# Patient Record
Sex: Female | Born: 2000
Health system: Southern US, Community
[De-identification: ages and names within clinical notes are randomized; demographics above are authoritative.]

## PROBLEM LIST (undated history)

## (undated) DIAGNOSIS — R079 Chest pain, unspecified: Secondary | ICD-10-CM

## (undated) DIAGNOSIS — R109 Unspecified abdominal pain: Secondary | ICD-10-CM

## (undated) DIAGNOSIS — D249 Benign neoplasm of unspecified breast: Secondary | ICD-10-CM

## (undated) DIAGNOSIS — U071 COVID-19: Secondary | ICD-10-CM

## (undated) HISTORY — DX: Unspecified abdominal pain: R10.9

## (undated) HISTORY — DX: Benign neoplasm of unspecified breast: D24.9

## (undated) HISTORY — DX: COVID-19: U07.1

## (undated) HISTORY — PX: NO PAST SURGERIES: SHX2092

## (undated) HISTORY — DX: Chest pain, unspecified: R07.9

---
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
## 2022-01-08 ENCOUNTER — Other Ambulatory Visit: Payer: Self-pay | Admitting: Student

## 2022-01-08 DIAGNOSIS — D241 Benign neoplasm of right breast: Secondary | ICD-10-CM

## 2022-01-22 ENCOUNTER — Other Ambulatory Visit: Payer: Self-pay

## 2022-01-22 ENCOUNTER — Other Ambulatory Visit: Payer: Self-pay | Admitting: Student

## 2022-01-22 ENCOUNTER — Ambulatory Visit
Admission: RE | Admit: 2022-01-22 | Discharge: 2022-01-22 | Disposition: A | Discharge status: Home / Self Care | Payer: BC Managed Care – PPO | Source: Ambulatory Visit | Attending: Student | Admitting: Student

## 2022-01-22 DIAGNOSIS — D241 Benign neoplasm of right breast: Secondary | ICD-10-CM

## 2022-01-28 ENCOUNTER — Ambulatory Visit: Admission: RE | Admit: 2022-01-28 | Payer: BC Managed Care – PPO | Source: Ambulatory Visit

## 2022-01-28 ENCOUNTER — Ambulatory Visit
Admission: RE | Admit: 2022-01-28 | Discharge: 2022-01-28 | Disposition: A | Discharge status: Home / Self Care | Payer: BC Managed Care – PPO | Source: Ambulatory Visit | Attending: Student | Admitting: Student

## 2022-01-28 DIAGNOSIS — D241 Benign neoplasm of right breast: Secondary | ICD-10-CM

## 2022-01-29 ENCOUNTER — Other Ambulatory Visit: Payer: BLUE CROSS/BLUE SHIELD

## 2022-02-05 ENCOUNTER — Other Ambulatory Visit: Payer: Self-pay | Admitting: General Surgery

## 2022-02-05 DIAGNOSIS — D241 Benign neoplasm of right breast: Secondary | ICD-10-CM

## 2022-02-09 ENCOUNTER — Other Ambulatory Visit: Payer: Self-pay | Admitting: General Surgery

## 2022-02-09 DIAGNOSIS — D241 Benign neoplasm of right breast: Secondary | ICD-10-CM

## 2022-02-27 ENCOUNTER — Other Ambulatory Visit: Payer: Self-pay

## 2022-02-27 ENCOUNTER — Encounter (HOSPITAL_BASED_OUTPATIENT_CLINIC_OR_DEPARTMENT_OTHER): Payer: Self-pay | Admitting: General Surgery

## 2022-03-03 MED ORDER — ENSURE PRE-SURGERY PO LIQD: 296.0000 mL | Freq: Once | ORAL | Status: DC

## 2022-03-03 NOTE — Progress Notes (Signed)

## 2022-03-09 ENCOUNTER — Ambulatory Visit
Admission: RE | Admit: 2022-03-09 | Discharge: 2022-03-09 | Disposition: A | Discharge status: Home / Self Care | Payer: BC Managed Care – PPO | Source: Ambulatory Visit | Attending: General Surgery | Admitting: General Surgery

## 2022-03-09 ENCOUNTER — Other Ambulatory Visit: Payer: Self-pay | Admitting: General Surgery

## 2022-03-09 DIAGNOSIS — D241 Benign neoplasm of right breast: Secondary | ICD-10-CM

## 2022-03-10 ENCOUNTER — Ambulatory Visit (HOSPITAL_BASED_OUTPATIENT_CLINIC_OR_DEPARTMENT_OTHER): Payer: BC Managed Care – PPO | Admitting: Anesthesiology

## 2022-03-10 ENCOUNTER — Encounter (HOSPITAL_BASED_OUTPATIENT_CLINIC_OR_DEPARTMENT_OTHER): Payer: Self-pay | Admitting: General Surgery

## 2022-03-10 ENCOUNTER — Other Ambulatory Visit: Payer: Self-pay

## 2022-03-10 ENCOUNTER — Encounter (HOSPITAL_BASED_OUTPATIENT_CLINIC_OR_DEPARTMENT_OTHER)
Admission: RE | Disposition: A | Discharge status: Home / Self Care | Payer: Self-pay | Source: Home / Self Care | Attending: General Surgery

## 2022-03-10 ENCOUNTER — Ambulatory Visit
Admission: RE | Admit: 2022-03-10 | Discharge: 2022-03-10 | Disposition: A | Discharge status: Home / Self Care | Payer: BC Managed Care – PPO | Source: Ambulatory Visit | Attending: General Surgery | Admitting: General Surgery

## 2022-03-10 ENCOUNTER — Ambulatory Visit (HOSPITAL_BASED_OUTPATIENT_CLINIC_OR_DEPARTMENT_OTHER)
Admission: RE | Admit: 2022-03-10 | Discharge: 2022-03-10 | Disposition: A | Discharge status: Home / Self Care | Payer: BC Managed Care – PPO | Attending: General Surgery | Admitting: General Surgery

## 2022-03-10 DIAGNOSIS — Z79899 Other long term (current) drug therapy: Secondary | ICD-10-CM | POA: Insufficient documentation

## 2022-03-10 DIAGNOSIS — D241 Benign neoplasm of right breast: Secondary | ICD-10-CM | POA: Insufficient documentation

## 2022-03-10 DIAGNOSIS — N6021 Fibroadenosis of right breast: Secondary | ICD-10-CM | POA: Diagnosis not present

## 2022-03-10 HISTORY — PX: RADIOACTIVE SEED GUIDED EXCISIONAL BREAST BIOPSY: SHX6490

## 2022-03-10 LAB — POCT PREGNANCY, URINE: Preg Test, Ur: NEGATIVE

## 2022-03-10 SURGERY — RADIOACTIVE SEED GUIDED BREAST BIOPSY
Anesthesia: General | Site: Breast | Laterality: Right

## 2022-03-10 MED ORDER — DEXAMETHASONE SODIUM PHOSPHATE 10 MG/ML IJ SOLN
INTRAMUSCULAR | Status: DC | PRN
  Administered 2022-03-10: 5 mg via INTRAVENOUS

## 2022-03-10 MED ORDER — CEFAZOLIN SODIUM-DEXTROSE 2-4 GM/100ML-% IV SOLN
INTRAVENOUS | Status: AC
  Filled 2022-03-10: qty 100

## 2022-03-10 MED ORDER — PROPOFOL 10 MG/ML IV BOLUS
INTRAVENOUS | Status: AC
  Filled 2022-03-10: qty 20

## 2022-03-10 MED ORDER — MIDAZOLAM HCL 2 MG/2ML IJ SOLN
INTRAMUSCULAR | Status: AC
  Filled 2022-03-10: qty 2

## 2022-03-10 MED ORDER — ONDANSETRON HCL 4 MG/2ML IJ SOLN
INTRAMUSCULAR | Status: AC
  Filled 2022-03-10: qty 2

## 2022-03-10 MED ORDER — FENTANYL CITRATE (PF) 100 MCG/2ML IJ SOLN
25.0000 ug | INTRAMUSCULAR | Status: DC | PRN
  Administered 2022-03-10 (×3): 50 ug via INTRAVENOUS

## 2022-03-10 MED ORDER — OXYCODONE HCL 5 MG PO TABS
ORAL_TABLET | ORAL | Status: AC
  Filled 2022-03-10: qty 1

## 2022-03-10 MED ORDER — CHLORHEXIDINE GLUCONATE CLOTH 2 % EX PADS: 6.0000 | MEDICATED_PAD | Freq: Once | CUTANEOUS | Status: DC

## 2022-03-10 MED ORDER — LIDOCAINE 2% (20 MG/ML) 5 ML SYRINGE
INTRAMUSCULAR | Status: AC
  Filled 2022-03-10: qty 5

## 2022-03-10 MED ORDER — FENTANYL CITRATE (PF) 100 MCG/2ML IJ SOLN
INTRAMUSCULAR | Status: AC
Start: 2022-03-10 — End: ?
  Filled 2022-03-10: qty 2

## 2022-03-10 MED ORDER — ACETAMINOPHEN 500 MG PO TABS
1000.0000 mg | ORAL_TABLET | ORAL | Status: AC
  Administered 2022-03-10: 1000 mg via ORAL

## 2022-03-10 MED ORDER — OXYCODONE HCL 5 MG/5ML PO SOLN: 5.0000 mg | Freq: Once | ORAL | Status: AC | PRN

## 2022-03-10 MED ORDER — DEXAMETHASONE SODIUM PHOSPHATE 10 MG/ML IJ SOLN
INTRAMUSCULAR | Status: AC
  Filled 2022-03-10: qty 1

## 2022-03-10 MED ORDER — ACETAMINOPHEN 500 MG PO TABS: 1000.0000 mg | ORAL_TABLET | Freq: Once | ORAL | Status: DC | PRN

## 2022-03-10 MED ORDER — ACETAMINOPHEN 160 MG/5ML PO SOLN: 1000.0000 mg | Freq: Once | ORAL | Status: DC | PRN

## 2022-03-10 MED ORDER — FENTANYL CITRATE (PF) 100 MCG/2ML IJ SOLN
INTRAMUSCULAR | Status: AC
  Filled 2022-03-10: qty 2

## 2022-03-10 MED ORDER — TRAMADOL HCL 50 MG PO TABS: 100.0000 mg | ORAL_TABLET | Freq: Four times a day (QID) | ORAL | 0 refills | Status: DC | PRN

## 2022-03-10 MED ORDER — MIDAZOLAM HCL 5 MG/5ML IJ SOLN
INTRAMUSCULAR | Status: DC | PRN
  Administered 2022-03-10: 2 mg via INTRAVENOUS

## 2022-03-10 MED ORDER — ACETAMINOPHEN 500 MG PO TABS
ORAL_TABLET | ORAL | Status: AC
Start: 2022-03-10 — End: ?
  Filled 2022-03-10: qty 2

## 2022-03-10 MED ORDER — LACTATED RINGERS IV SOLN: INTRAVENOUS | Status: DC

## 2022-03-10 MED ORDER — OXYCODONE HCL 5 MG PO TABS
5.0000 mg | ORAL_TABLET | Freq: Once | ORAL | Status: AC | PRN
  Administered 2022-03-10: 5 mg via ORAL

## 2022-03-10 MED ORDER — BUPIVACAINE HCL (PF) 0.25 % IJ SOLN
INTRAMUSCULAR | Status: DC | PRN
  Administered 2022-03-10: 20 mL

## 2022-03-10 MED ORDER — PROPOFOL 500 MG/50ML IV EMUL
INTRAVENOUS | Status: DC | PRN
  Administered 2022-03-10: 150 ug/kg/min via INTRAVENOUS

## 2022-03-10 MED ORDER — CEFAZOLIN SODIUM-DEXTROSE 2-4 GM/100ML-% IV SOLN
2.0000 g | INTRAVENOUS | Status: AC
  Administered 2022-03-10: 2 g via INTRAVENOUS

## 2022-03-10 MED ORDER — ONDANSETRON HCL 4 MG/2ML IJ SOLN
INTRAMUSCULAR | Status: DC | PRN
  Administered 2022-03-10: 4 mg via INTRAVENOUS

## 2022-03-10 MED ORDER — ACETAMINOPHEN 10 MG/ML IV SOLN: 1000.0000 mg | Freq: Once | INTRAVENOUS | Status: DC | PRN

## 2022-03-10 MED ORDER — FENTANYL CITRATE (PF) 100 MCG/2ML IJ SOLN
INTRAMUSCULAR | Status: DC | PRN
Start: 2022-03-10 — End: 2022-03-10
  Administered 2022-03-10 (×2): 25 ug via INTRAVENOUS
  Administered 2022-03-10 (×2): 50 ug via INTRAVENOUS

## 2022-03-10 MED ORDER — PROPOFOL 10 MG/ML IV BOLUS
INTRAVENOUS | Status: DC | PRN
  Administered 2022-03-10: 200 mg via INTRAVENOUS

## 2022-03-10 SURGICAL SUPPLY — 55 items
ADH SKN CLS APL DERMABOND .7 (GAUZE/BANDAGES/DRESSINGS) ×1
APL PRP STRL LF DISP 70% ISPRP (MISCELLANEOUS) ×1
APPLIER CLIP 9.375 MED OPEN (MISCELLANEOUS)
APR CLP MED 9.3 20 MLT OPN (MISCELLANEOUS)
BINDER BREAST LRG (GAUZE/BANDAGES/DRESSINGS) IMPLANT
BINDER BREAST MEDIUM (GAUZE/BANDAGES/DRESSINGS) IMPLANT
BINDER BREAST XLRG (GAUZE/BANDAGES/DRESSINGS) IMPLANT
BINDER BREAST XXLRG (GAUZE/BANDAGES/DRESSINGS) IMPLANT
BLADE SURG 15 STRL LF DISP TIS (BLADE) ×1 IMPLANT
BLADE SURG 15 STRL SS (BLADE) ×2
CANISTER SUC SOCK COL 7IN (MISCELLANEOUS) IMPLANT
CANISTER SUCT 1200ML W/VALVE (MISCELLANEOUS) IMPLANT
CHLORAPREP W/TINT 26 (MISCELLANEOUS) ×2 IMPLANT
CLIP APPLIE 9.375 MED OPEN (MISCELLANEOUS) IMPLANT
CLIP TI WIDE RED SMALL 6 (CLIP) IMPLANT
COVER BACK TABLE 60X90IN (DRAPES) ×2 IMPLANT
COVER MAYO STAND STRL (DRAPES) ×2 IMPLANT
COVER PROBE W GEL 5X96 (DRAPES) ×2 IMPLANT
DERMABOND ADVANCED (GAUZE/BANDAGES/DRESSINGS) ×1
DERMABOND ADVANCED .7 DNX12 (GAUZE/BANDAGES/DRESSINGS) ×1 IMPLANT
DRAPE LAPAROSCOPIC ABDOMINAL (DRAPES) ×2 IMPLANT
DRAPE UTILITY XL STRL (DRAPES) ×2 IMPLANT
DRSG TEGADERM 4X4.75 (GAUZE/BANDAGES/DRESSINGS) IMPLANT
ELECT COATED BLADE 2.86 ST (ELECTRODE) ×2 IMPLANT
ELECT REM PT RETURN 9FT ADLT (ELECTROSURGICAL) ×2
ELECTRODE REM PT RTRN 9FT ADLT (ELECTROSURGICAL) ×1 IMPLANT
GAUZE SPONGE 4X4 12PLY STRL LF (GAUZE/BANDAGES/DRESSINGS) IMPLANT
GLOVE SURG ENC MOIS LTX SZ7 (GLOVE) ×4 IMPLANT
GLOVE SURG UNDER POLY LF SZ7.5 (GLOVE) ×2 IMPLANT
GOWN STRL REUS W/ TWL LRG LVL3 (GOWN DISPOSABLE) ×2 IMPLANT
GOWN STRL REUS W/TWL LRG LVL3 (GOWN DISPOSABLE) ×4
HEMOSTAT ARISTA ABSORB 3G PWDR (HEMOSTASIS) IMPLANT
KIT MARKER MARGIN INK (KITS) ×2 IMPLANT
NDL HYPO 25X1 1.5 SAFETY (NEEDLE) ×1 IMPLANT
NEEDLE HYPO 25X1 1.5 SAFETY (NEEDLE) ×2 IMPLANT
NS IRRIG 1000ML POUR BTL (IV SOLUTION) IMPLANT
PACK BASIN DAY SURGERY FS (CUSTOM PROCEDURE TRAY) ×2 IMPLANT
PENCIL SMOKE EVACUATOR (MISCELLANEOUS) ×2 IMPLANT
RETRACTOR ONETRAX LX 90X20 (MISCELLANEOUS) ×1 IMPLANT
SLEEVE SCD COMPRESS KNEE MED (STOCKING) ×2 IMPLANT
SPIKE FLUID TRANSFER (MISCELLANEOUS) IMPLANT
SPONGE T-LAP 4X18 ~~LOC~~+RFID (SPONGE) ×2 IMPLANT
STRIP CLOSURE SKIN 1/2X4 (GAUZE/BANDAGES/DRESSINGS) ×2 IMPLANT
SUT MNCRL AB 4-0 PS2 18 (SUTURE) ×1 IMPLANT
SUT MON AB 5-0 PS2 18 (SUTURE) IMPLANT
SUT SILK 2 0 SH (SUTURE) IMPLANT
SUT VIC AB 2-0 SH 27 (SUTURE) ×2
SUT VIC AB 2-0 SH 27XBRD (SUTURE) ×1 IMPLANT
SUT VIC AB 3-0 SH 27 (SUTURE) ×2
SUT VIC AB 3-0 SH 27X BRD (SUTURE) ×1 IMPLANT
SYR CONTROL 10ML LL (SYRINGE) ×2 IMPLANT
TOWEL GREEN STERILE FF (TOWEL DISPOSABLE) ×2 IMPLANT
TRAY FAXITRON CT DISP (TRAY / TRAY PROCEDURE) ×2 IMPLANT
TUBE CONNECTING 20X1/4 (TUBING) IMPLANT
YANKAUER SUCT BULB TIP NO VENT (SUCTIONS) IMPLANT

## 2022-03-10 NOTE — Anesthesia Procedure Notes (Signed)
Procedure Name: LMA Insertion ?Date/Time: 03/10/2022 10:57 AM ?Performed by: Glory Buff, CRNA ?Pre-anesthesia Checklist: Patient identified, Emergency Drugs available, Suction available and Patient being monitored ?Patient Re-evaluated:Patient Re-evaluated prior to induction ?Oxygen Delivery Method: Circle system utilized ?Preoxygenation: Pre-oxygenation with 100% oxygen ?Induction Type: IV induction ?LMA: LMA inserted ?LMA Size: 4.0 ?Number of attempts: 1 ?Placement Confirmation: positive ETCO2 ?Tube secured with: Tape ?Dental Injury: Teeth and Oropharynx as per pre-operative assessment  ? ? ? ? ?

## 2022-03-10 NOTE — Discharge Instructions (Addendum)
Call your surgeon if you experience:  ? ?1.  Fever over 101.0. ?2.  Inability to urinate. ?3.  Nausea and/or vomiting. ?4.  Extreme swelling or bruising at the surgical site. ?5.  Continued bleeding from the incision. ?6.  Increased pain, redness or drainage from the incision. ?7.  Problems related to your pain medication. ?8.  Any problems and/or concerns  ? ?May take Tylenol after 3pm, if needed.  ? ? ?Post Anesthesia Home Care Instructions ? ?Activity: ?Get plenty of rest for the remainder of the day. A responsible individual must stay with you for 24 hours following the procedure.  ?For the next 24 hours, DO NOT: ?-Drive a car ?-Paediatric nurse ?-Drink alcoholic beverages ?-Take any medication unless instructed by your physician ?-Make any legal decisions or sign important papers. ? ?Meals: ?Start with liquid foods such as gelatin or soup. Progress to regular foods as tolerated. Avoid greasy, spicy, heavy foods. If nausea and/or vomiting occur, drink only clear liquids until the nausea and/or vomiting subsides. Call your physician if vomiting continues. ? ?Special Instructions/Symptoms: ?Your throat may feel dry or sore from the anesthesia or the breathing tube placed in your throat during surgery. If this causes discomfort, gargle with warm salt water. The discomfort should disappear within 24 hours. ? ?If you had a scopolamine patch placed behind your ear for the management of post- operative nausea and/or vomiting: ? ?1. The medication in the patch is effective for 72 hours, after which it should be removed.  Wrap patch in a tissue and discard in the trash. Wash hands thoroughly with soap and water. ?2. You may remove the patch earlier than 72 hours if you experience unpleasant side effects which may include dry mouth, dizziness or visual disturbances. ?3. Avoid touching the patch. Wash your hands with soap and water after contact with the patch. ?    ?

## 2022-03-10 NOTE — Anesthesia Preprocedure Evaluation (Addendum)
Anesthesia Evaluation  ?Patient identified by MRN, date of birth, ID band ?Patient awake ? ? ? ?Reviewed: ?Allergy & Precautions, NPO status , Patient's Chart, lab work & pertinent test results ? ?History of Anesthesia Complications ?Negative for: history of anesthetic complications ? ?Airway ?Mallampati: I ? ?TM Distance: >3 FB ?Neck ROM: Full ? ? ? Dental ? ?(+) Teeth Intact, Dental Advisory Given ?  ?Pulmonary ?neg pulmonary ROS,  ?  ?breath sounds clear to auscultation ? ? ? ? ? ? Cardiovascular ?negative cardio ROS ? ? ?Rhythm:Regular  ? ?  ?Neuro/Psych ?negative neurological ROS ? negative psych ROS  ? GI/Hepatic ?negative GI ROS, Neg liver ROS,   ?Endo/Other  ?negative endocrine ROS ? Renal/GU ?negative Renal ROS  ? ?  ?Musculoskeletal ?negative musculoskeletal ROS ?(+)  ? Abdominal ?  ?Peds ? Hematology ?negative hematology ROS ?(+)   ?Anesthesia Other Findings ? ? Reproductive/Obstetrics ?Lab Results ?     Component                Value               Date                 ?     PREGTESTUR               NEGATIVE            03/10/2022           ? ? ?  ? ? ? ? ? ? ? ? ? ? ? ? ? ?  ?  ? ? ? ? ? ? ? ?Anesthesia Physical ?Anesthesia Plan ? ?ASA: 1 ? ?Anesthesia Plan: General  ? ?Post-op Pain Management: Toradol IV (intra-op)*  ? ?Induction: Intravenous ? ?PONV Risk Score and Plan: 3 and Ondansetron, Dexamethasone, Propofol infusion, Midazolam and TIVA ? ?Airway Management Planned: LMA ? ?Additional Equipment: None ? ?Intra-op Plan:  ? ?Post-operative Plan: Extubation in OR ? ?Informed Consent: I have reviewed the patients History and Physical, chart, labs and discussed the procedure including the risks, benefits and alternatives for the proposed anesthesia with the patient or authorized representative who has indicated his/her understanding and acceptance.  ? ? ? ?Dental advisory given ? ?Plan Discussed with: CRNA ? ?Anesthesia Plan Comments:   ? ? ? ? ? ? ?Anesthesia Quick  Evaluation ? ?

## 2022-03-10 NOTE — Op Note (Signed)
Preoperative diagnosis: Right breast fibroadenoma ?Postoperative diagnosis: Same as above ?Procedure: Right breast radioactive seed guided excisional biopsy ?Surgeon: Dr. Serita Grammes ?Anesthesia: General ?Specimens: Right breast mass to pathology ?Complications: None ?Drains: None ?Sponge needle count was correct at completion ?Disposition to recovery stable condition ? ?Indications:This is an 21 year old female who I saw in October 2021 with a 3.4 cm fibroadenoma. I excised this. This pathology was a fibroadenoma. This was in December 2021. Since then she has recently over the last several months noticed a mass in the similar position. This is in the upper outer right breast. Ultrasound showed a 2.2 cm mass. Biopsy of this is a fibroadenoma. She would like to consider excision due to discomfort..  This was vague to feel so I did have a seed placed prior to beginning ? ?Procedure: After informed consent was obtained the patient was taken to the operating room.  She had a seed placed prior to beginning.  She was given antibiotics.  SCDs were placed.  She was placed under general anesthesia without complication. ? ?I then infiltrated Marcaine and the old scar as well as throughout the right upper outer quadrant.  The seed and the mass were in the very upper outer quadrant.  I did excise her old scar as it was mildly hypertrophic.  I then used cautery to dissected the seed.  This was very difficult still to palpate as it was in very dense tissue.  The seed and the clip or I think were on the outer part of this is I removed both of these separately and then removed what was a palpable mass.  This was marked with paint.  I then obtained hemostasis.  I closed the deep breast tissue with 2-0 Vicryl sutures.  I closed the skin with 3-0 Vicryl and 5-0 Monocryl.  Glue and some Steri-Strips were applied.  She tolerated this well was extubated transferred to recovery stable. ?

## 2022-03-10 NOTE — Interval H&P Note (Signed)
History and Physical Interval Note: ? ?03/10/2022 ?10:34 AM ? ?Rebecca Matthews  has presented today for surgery, with the diagnosis of RIGHT BREAST MASS.  The various methods of treatment have been discussed with the patient and family. After consideration of risks, benefits and other options for treatment, the patient has consented to  Procedure(s): ?RADIOACTIVE SEED GUIDED EXCISIONAL RIGHT BREAST BIOPSY (Right) as a surgical intervention.  The patient's history has been reviewed, patient examined, no change in status, stable for surgery.  I have reviewed the patient's chart and labs.  Questions were answered to the patient's satisfaction.   ? ? ?Rolm Bookbinder ? ? ?

## 2022-03-10 NOTE — H&P (Signed)
?  This is an 21 year old female who I saw in October 2021 with a 3.4 cm fibroadenoma. I excised this. This pathology was a fibroadenoma. This was in December 2021. Since then she has recently over the last several months noticed a mass in the similar position. This is in the upper outer right breast. Ultrasound showed a 2.2 cm mass. Biopsy of this is a fibroadenoma. She would like to consider excision due to discomfort.. ? ? ?Review of Systems: ?A complete review of systems was obtained from the patient. I have reviewed this information and discussed as appropriate with the patient. See HPI as well for other ROS. ? ?Review of Systems  ?All other systems reviewed and are negative. ? ? ?Medical History: ?History reviewed. No pertinent past medical history. ? ?Patient Active Problem List  ?Diagnosis  ? Abdominal pain, bilateral lower quadrant  ? Lactose intolerance  ? ?Past Surgical History:  ?Procedure Laterality Date  ? BREAST EXCISIONAL BIOPSY Right  ? ? ?No Known Allergies ? ?Current Outpatient Medications on File Prior to Visit  ?Medication Sig Dispense Refill  ? dicyclomine (BENTYL) 20 mg tablet 1 tablet daily before school. 60 tablet 1  ? ?No current facility-administered medications on file prior to visit.  ? ?History reviewed. No pertinent family history.  ? ?Social History  ? ?Tobacco Use  ?Smoking Status Never  ?Smokeless Tobacco Never  ? ? ?Social History  ? ?Socioeconomic History  ? Marital status: Single  ?Tobacco Use  ? Smoking status: Never  ? Smokeless tobacco: Never  ?Substance and Sexual Activity  ? Alcohol use: Never  ? Drug use: Never  ? ?Objective:  ? ?Physical Exam ?Constitutional:  ?Appearance: Normal appearance.  ?Chest:  ?Breasts: ?Right: Mass present. No inverted nipple or nipple discharge.  ?Left: No inverted nipple, mass or nipple discharge.  ?Comments: Vague 2 cm mass  ?Lymphadenopathy:  ?Upper Body:  ?Right upper body: No supraclavicular or axillary adenopathy.  ?Left upper body: No  supraclavicular or axillary adenopathy.  ?Neurological:  ?Mental Status: She is alert.  ? ? ? ?Assessment and Plan:  ? ?Fibroadenoma of right breast ? ?Right breast mass seed guided excisional biopsy ? ?We discussed options including a 58-monthfollow-up versus excision. She would like to have this area excised. I think it is unlikely that the other 1 recurred but there may have been one near this that has now grown larger. We discussed excision using seed guidance. I will plan on excising her old scar as well. She is going to call when she figures out timing.  ?

## 2022-03-10 NOTE — Anesthesia Postprocedure Evaluation (Signed)
Anesthesia Post Note ? ?Patient: Rebecca Matthews ? ?Procedure(s) Performed: RADIOACTIVE SEED GUIDED EXCISIONAL RIGHT BREAST BIOPSY (Right: Breast) ? ?  ? ?Patient location during evaluation: PACU ?Anesthesia Type: General ?Level of consciousness: awake and alert ?Pain management: pain level controlled ?Vital Signs Assessment: post-procedure vital signs reviewed and stable ?Respiratory status: spontaneous breathing, nonlabored ventilation, respiratory function stable and patient connected to nasal cannula oxygen ?Cardiovascular status: blood pressure returned to baseline and stable ?Postop Assessment: no apparent nausea or vomiting ?Anesthetic complications: no ? ? ?No notable events documented. ? ?Last Vitals:  ?Vitals:  ? 03/10/22 1228 03/10/22 1314  ?BP: 100/62 105/63  ?Pulse: 69 67  ?Resp: (!) 21 15  ?Temp:  37.1 ?C  ?SpO2: 100% 98%  ?  ?Last Pain:  ?Vitals:  ? 03/10/22 1314  ?TempSrc: Oral  ?PainSc: 4   ? ? ?  ?  ?  ?  ?  ?  ? ?Octavia Mottola L Consandra Laske ? ? ? ? ?

## 2022-03-10 NOTE — Transfer of Care (Signed)
Immediate Anesthesia Transfer of Care Note ? ?Patient: Rebecca Matthews ? ?Procedure(s) Performed: RADIOACTIVE SEED GUIDED EXCISIONAL RIGHT BREAST BIOPSY (Right: Breast) ? ?Patient Location: PACU ? ?Anesthesia Type:General ? ?Level of Consciousness: drowsy and patient cooperative ? ?Airway & Oxygen Therapy: Patient Spontanous Breathing and Patient connected to face mask oxygen ? ?Post-op Assessment: Report given to RN and Post -op Vital signs reviewed and stable ? ?Post vital signs: Reviewed and stable ? ?Last Vitals:  ?Vitals Value Taken Time  ?BP    ?Temp    ?Pulse 98 03/10/22 1145  ?Resp 20 03/10/22 1145  ?SpO2 100 % 03/10/22 1145  ?Vitals shown include unvalidated device data. ? ?Last Pain:  ?Vitals:  ? 03/10/22 0855  ?TempSrc: Oral  ?PainSc: 0-No pain  ?   ? ?Patients Stated Pain Goal: 1 (03/10/22 0855) ? ?Complications: No notable events documented. ?

## 2022-03-11 ENCOUNTER — Encounter (HOSPITAL_BASED_OUTPATIENT_CLINIC_OR_DEPARTMENT_OTHER): Payer: Self-pay | Admitting: General Surgery

## 2022-03-11 ENCOUNTER — Encounter (HOSPITAL_COMMUNITY): Payer: Self-pay

## 2022-03-11 NOTE — Progress Notes (Signed)
Left message stating courtesy call and if any questions or concerns please call the doctors office.  

## 2022-03-12 LAB — SURGICAL PATHOLOGY

## 2023-01-18 IMAGING — US US NEEDLE LOCALIZATION*R*
1 series · 2 of 2 positions shown · non-contrast
Comparison: Previous exam(s).

CLINICAL DATA: Radioactive seed localization of RIGHT breast
fibroadenoma prior to excision.

EXAM:
ULTRASOUND GUIDED RADIOACTIVE SEED LOCALIZATION OF THE RIGHT BREAST

[Series 1: us needle localization*right* · 0.06mm/px · 2 of 2 slices shown]
[im 1/2]
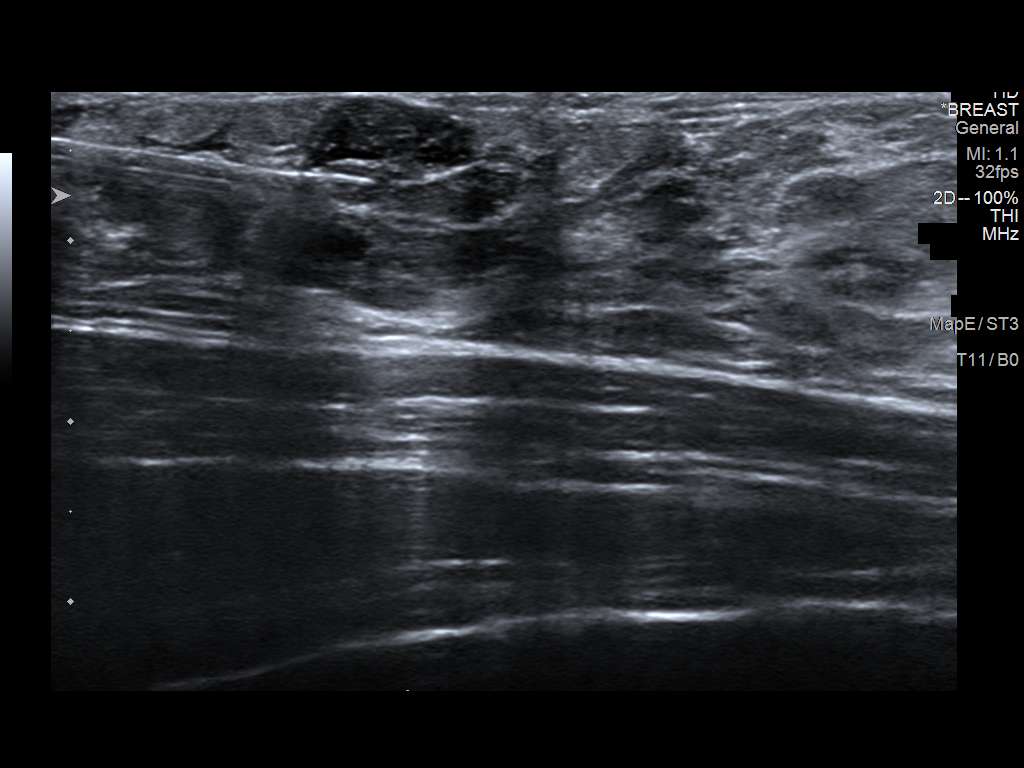
[im 2/2]
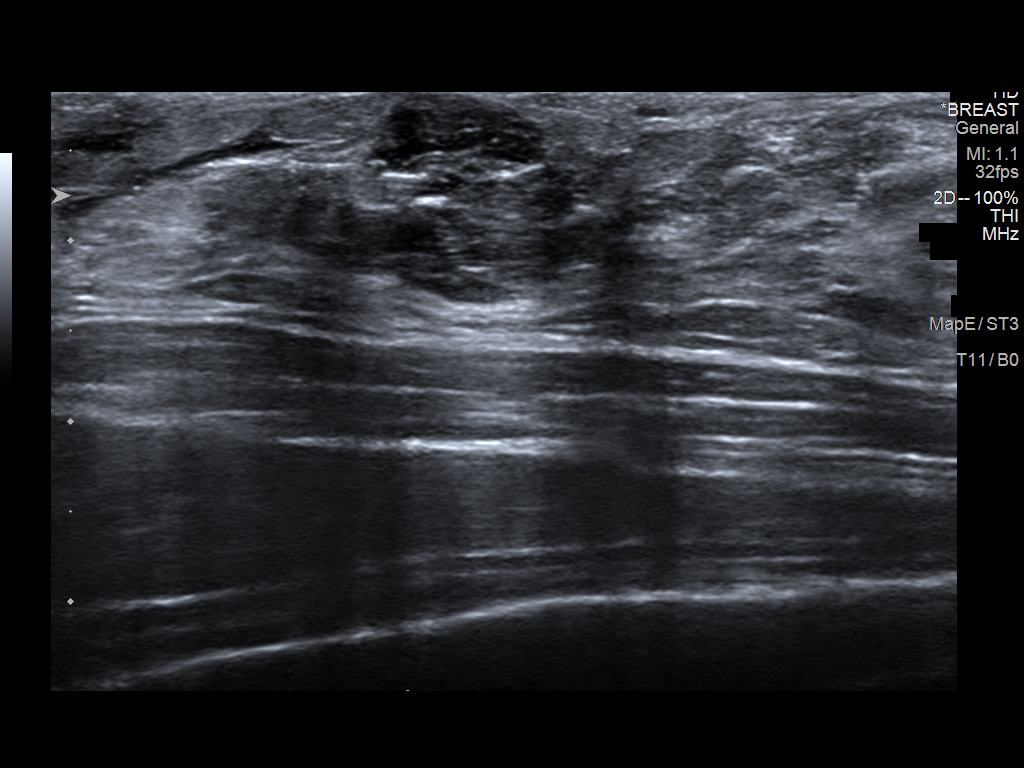

[2 of 2 positions shown; findings below may reference images not displayed]

FINDINGS: Patient presents for radioactive seed localization prior to RIGHT
breast excision. I met with the patient and we discussed the
procedure of seed localization including benefits and alternatives.
We discussed the high likelihood of a successful procedure. We
discussed the risks of the procedure including infection, bleeding,
tissue injury and further surgery. We discussed the low dose of
radioactivity involved in the procedure. Informed, written consent
was given.

The usual time-out protocol was performed immediately prior to the
procedure.

Using ultrasound guidance, sterile technique, 1% lidocaine and an
E-[id number] radioactive seed, the 2.2 cm mass at the 10 o'clock position
of the RIGHT breast 6 cm from the nipple was localized using a
LATERAL approach. Satisfactory position was confirmed
sonographically.

Follow-up survey of the patient confirms presence of the radioactive
seed.

Order number of [id number] seed:  [id number].

Total activity:  0.239 millicuries.  Reference Date: 02/02/2022.

The patient tolerated the procedure well and was released from the
[REDACTED]. She was given instructions regarding seed removal.
IMPRESSION: Radioactive seed localization RIGHT breast. No apparent
complications.

## 2023-01-19 IMAGING — MG MM BREAST SURGICAL SPECIMEN
2 series · 4 of 4 positions shown · non-contrast
Comparison: Previous exam(s).

CLINICAL DATA: Status post surgical excision after earlier
radioactive seed localization.

EXAM:
SPECIMEN RADIOGRAPH OF THE RIGHT BREAST

[Series 1: R · right · 0.07mm/px · 2 of 2 slices shown (1 of 2)]
[im 1/2]
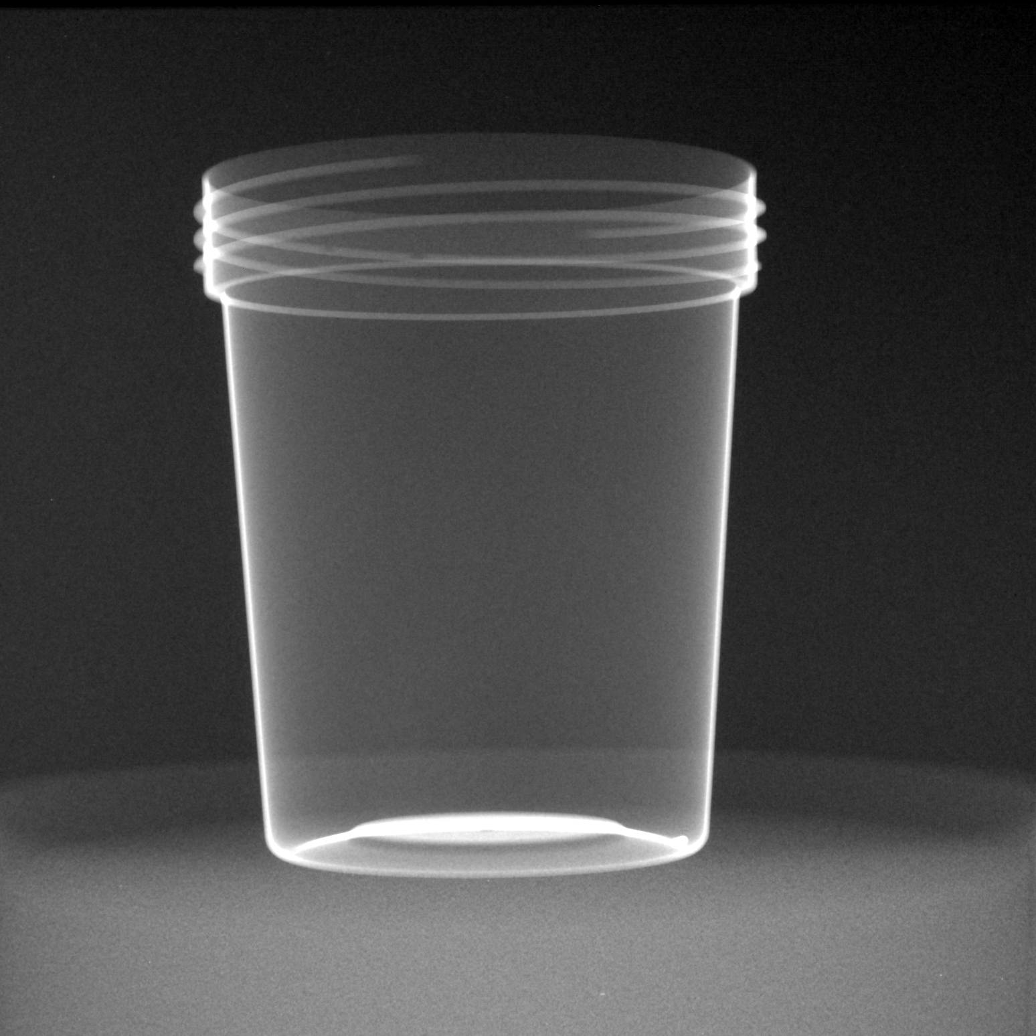
[im 2/2]
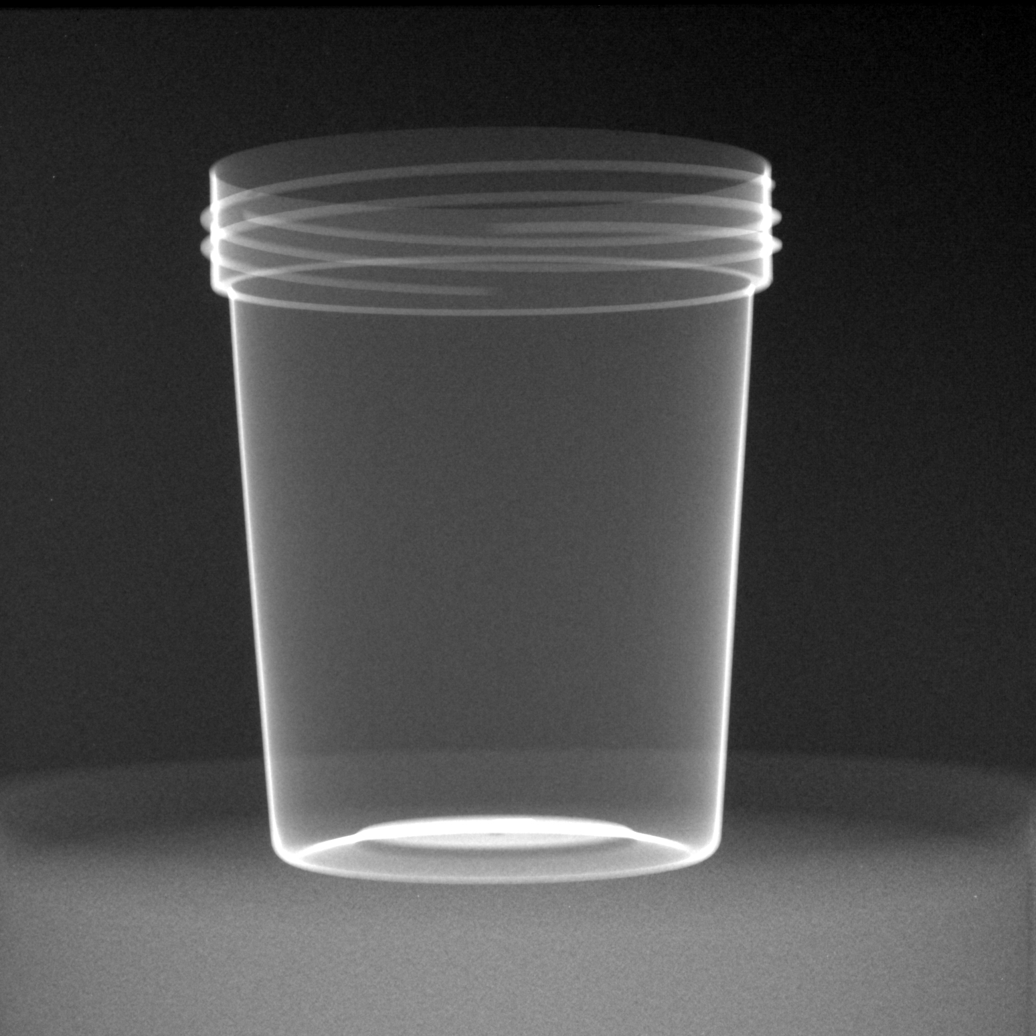

[Series 4: R · right · 0.07mm/px · 2 of 2 slices shown (2 of 2)]
[im 1/2]
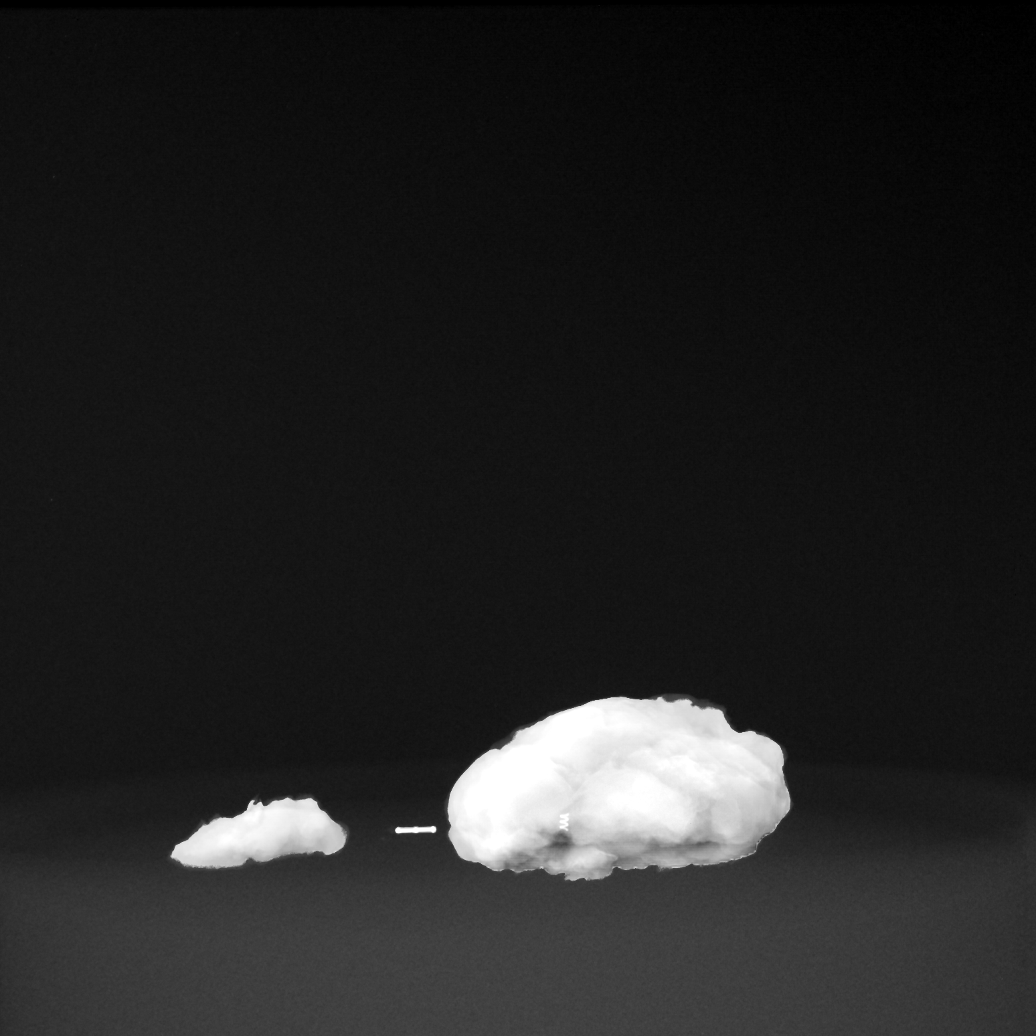
[im 2/2]
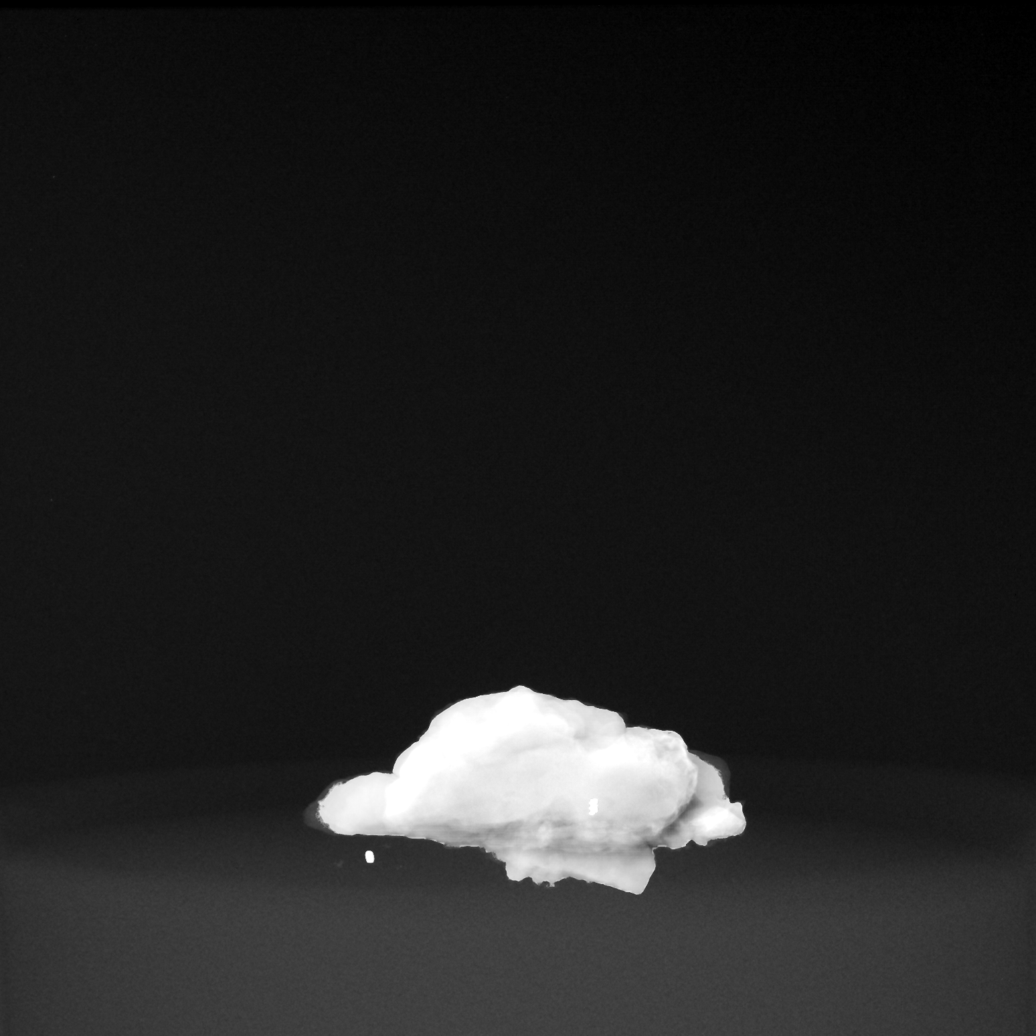

[4 of 4 positions shown; findings below may reference images not displayed]

FINDINGS: Status post excision of the right breast. The radioactive seed and
biopsy marker clips (including the Znaidi Dj clip placed at the time
of ultrasound-guided biopsy performed on 01/28/2022) are present and
appear completely intact. Findings discussed with the OR staff
during the procedure
IMPRESSION: Specimen radiograph of the right breast.

## 2023-01-22 NOTE — Progress Notes (Signed)
CARDIOLOGY CONSULT NOTE       Patient ID: Rebecca Matthews MRN: [id number] DOB/AGE: 2001/10/23 22 y.o.  Admit date: (Not on file) Referring Physician: Starleen Blue and Theodosia Quay FNP  Primary Physician: Jamie Kato, MD Primary Cardiologist: New Reason for Consultation: Atypical chest pain   Active Problems:   * No active hospital problems. *   HPI:  22 y.o. referred by FNP Owens Shark at [facility] for atypical chest pain Seen in their office 01/01/23 Pain under left breast sharp Started after working out sometimes radiates to upper arm and jaw Pain comes and goes randomly not related to exertion  ECG read by computer as " abnormal heart block" rate 67 normal intervals To my review of [facility] records ECG;s wer fine with sinus arrhythmia and no heart block Apparently ECG in office read as ICRBBB which is totally benign Not clear why FNP would have sent her to ER for that   She has anorexia Pain worse with movement  ER w/u negative as above normal ECG x 2 With just sinus arrhythmia Had some anemia with Hct 31.3 Troponin negative x 2 and d dimer negative  CXR NAD   Eating disorder doing some better Her pains are atypical mostly resting sharp some pleuritic and likely related to ribs/pleurisy  I take care of her dad who has some anxiety/ADHD/atypical chest pain   ROS All other systems reviewed and negative except as noted above  Past Medical History:  Diagnosis Date   Abdominal pain    Chest pain    COVID    Fibroadenoma of breast     Family History  Problem Relation Age of Onset   Breast cancer Paternal Grandmother        early 56's   Breast cancer Maternal Grandmother 63    Social History   Socioeconomic History   Marital status: Single    Spouse name: Not on file   Number of children: Not on file   Years of education: Not on file   Highest education level: Not on file  Occupational History   Not on file  Tobacco Use   Smoking status: Never   Smokeless tobacco: Never   Vaping Use   Vaping Use: Never used  Substance and Sexual Activity   Alcohol use: Never   Drug use: Never   Sexual activity: Not on file  Other Topics Concern   Not on file  Social History Narrative   Not on file   Social Determinants of Health   Financial Resource Strain: Not on file  Food Insecurity: Not on file  Transportation Needs: Not on file  Physical Activity: Not on file  Stress: Not on file  Social Connections: Not on file  Intimate Partner Violence: Not on file    Past Surgical History:  Procedure Laterality Date   BREAST CYST EXCISION Right 11/18/2020   Procedure: RIGHT BREAST MASS EXCISION;  Surgeon: Rolm Bookbinder, MD;  Location: [place];  Service: General;  Laterality: Right;   RADIOACTIVE SEED GUIDED EXCISIONAL BREAST BIOPSY Right 03/10/2022   Procedure: RADIOACTIVE SEED GUIDED EXCISIONAL RIGHT BREAST BIOPSY;  Surgeon: Rolm Bookbinder, MD;  Location: [place];  Service: General;  Laterality: Right;      Current Outpatient Medications:    dicyclomine (BENTYL) 20 MG tablet, 1 tablet daily before school., Disp: , Rfl:    famotidine (PEPCID) 10 MG tablet, Take 10 mg by mouth 2 (two) times daily., Disp: , Rfl:    multivitamin (  VIT W/EXTRA C) CHEW chewable tablet, Chew by mouth., Disp: , Rfl:    RETIN-A MICRO PUMP 0.06 % GEL, APP AA QHS, Disp: , Rfl:    traMADol (ULTRAM) 50 MG tablet, Take 2 tablets (100 mg total) by mouth every 6 (six) hours as needed., Disp: 10 tablet, Rfl: 0    Physical Exam: There were no vitals taken for this visit.    Affect appropriate Healthy:  appears stated age 19: normal Neck supple with no adenopathy JVP normal no bruits no thyromegaly Lungs clear with no wheezing and good diaphragmatic motion Heart:  S1/S2 no murmur, no rub, gallop or click PMI normal Abdomen: benighn, BS positve, no tenderness, no AAA no bruit.  No HSM or HJR Distal pulses intact with no bruits No edema Neuro  non-focal Skin warm and dry No muscular weakness   Labs:  No results found for: "WBC", "HGB", "HCT", "MCV", "PLT" No results for input(s): "NA", "K", "CL", "CO2", "BUN", "CREATININE", "CALCIUM", "PROT", "BILITOT", "ALKPHOS", "ALT", "AST", "GLUCOSE" in the last 168 hours.  Invalid input(s): "LABALBU" No results found for: "CKTOTAL", "CKMB", "CKMBINDEX", "TROPONINI" No results found for: "CHOL" No results found for: "HDL" No results found for: "LDLCALC" No results found for: "TRIG" No results found for: "CHOLHDL" No results found for: "LDLDIRECT"    Radiology: No results found.  EKG: SEE HPI  sinus arrhythmia otherwise normal   ASSESSMENT AND PLAN:   Chest pain atypical muscular will order ETT and echo.  CXR and labs ok in ER ECG with only sinus arrhythmia should not have been sent to ER for ICRBBB She is a bit young for calcium score to have any utility ECG in office today totally normal. Described the benign nature of ICRBBB And sinus arrhythmia offerered  to order exercise stress echo and she indicated she would let us know if her symptoms return/worsen and she wants to schedule Mother present and in agreement  Anemia:  f/u primary  Anorexia:  behavioral health counseling   F/U PRN   Signed: Jenkins Rouge 02/02/2023, 4:07 PM

## 2023-02-02 ENCOUNTER — Ambulatory Visit: Payer: BC Managed Care – PPO | Attending: Cardiovascular Disease | Admitting: Cardiovascular Disease

## 2023-02-02 ENCOUNTER — Encounter: Payer: Self-pay | Admitting: Cardiovascular Disease

## 2023-02-02 VITALS — HR 81 | Ht 67.0 in | Wt 155.0 lb

## 2023-02-02 DIAGNOSIS — R079 Chest pain, unspecified: Secondary | ICD-10-CM | POA: Diagnosis not present

## 2023-02-02 DIAGNOSIS — R630 Anorexia: Secondary | ICD-10-CM | POA: Diagnosis not present

## 2023-02-02 DIAGNOSIS — D508 Other iron deficiency anemias: Secondary | ICD-10-CM

## 2023-02-02 NOTE — Patient Instructions (Addendum)
Medication Instructions:  Your physician recommends that you continue on your current medications as directed. Please refer to the Current Medication list given to you today.  *If you need a refill on your cardiac medications before your next appointment, please call your pharmacy*  Lab Work: If you have labs (blood work) drawn today and your tests are completely normal, you will receive your results only by: [facility] (if you have MyChart) OR A paper copy in the mail If you have any lab test that is abnormal or we need to change your treatment, we will call you to review the results.  Testing/Procedures: Your physician has requested that you have an echocardiogram. Echocardiography is a painless test that uses sound waves to create images of your heart. It provides your doctor with information about the size and shape of your heart and how well your heart's chambers and valves are working. This procedure takes approximately one hour. There are no restrictions for this procedure. Please do NOT wear cologne, perfume, aftershave, or lotions (deodorant is allowed). Please arrive 15 minutes prior to your appointment time.  Your physician has requested that you have an exercise tolerance test. For further information please visit [email or web address]. Please also follow instruction sheet, as given.  Follow-Up: At [facility], you and your health needs are our priority.  As part of our continuing mission to provide you with exceptional heart care, we have created designated Provider Care Teams.  These Care Teams include your primary Cardiologist (physician) and Advanced Practice Providers (APPs -  Physician Assistants and Nurse Practitioners) who all work together to provide you with the care you need, when you need it.  We recommend signing up for the patient portal called "MyChart".  Sign up information is provided on this After Visit Summary.  MyChart is used to connect with patients  for Virtual Visits (Telemedicine).  Patients are able to view lab/test results, encounter notes, upcoming appointments, etc.  Non-urgent messages can be sent to your provider as well.   To learn more about what you can do with MyChart, go to [email or web address].    Your next appointment:   As needed  Provider:   Jenkins Rouge, MD

## 2023-02-03 NOTE — Addendum Note (Signed)
Addended by: Dionicio Stall on: 02/03/2023 05:33 PM   Modules accepted: Orders
